# Patient Record
Sex: Female | Born: 2000 | Hispanic: Yes | Marital: Single | Smoking: Never smoker
Health system: Southern US, Community
[De-identification: ages and names within clinical notes are randomized; demographics above are authoritative.]

---
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
## 2020-01-02 ENCOUNTER — Ambulatory Visit: Payer: Self-pay | Admitting: Licensed Clinical Social Worker

## 2020-01-07 ENCOUNTER — Other Ambulatory Visit: Payer: Self-pay

## 2020-01-07 ENCOUNTER — Ambulatory Visit (INDEPENDENT_AMBULATORY_CARE_PROVIDER_SITE_OTHER): Payer: Medicaid Other | Admitting: Licensed Clinical Social Worker

## 2020-01-07 DIAGNOSIS — F4323 Adjustment disorder with mixed anxiety and depressed mood: Secondary | ICD-10-CM

## 2020-01-07 NOTE — BH Specialist Note (Signed)
Integrated Behavioral Health Follow Up Visit  MRN: [id number] Name: Patty Patterson  Number of Integrated Behavioral Health Clinician visits: 7 Session Start time: 2:47  Session End time: 3:20 Total time: 33  Type of Service: Integrated Behavioral Health- Individual/Family Interpretor:No. Interpretor Name and Language: n/a  SUBJECTIVE: Patty Patterson is a 19 y.o. female accompanied by Self Patient was referred by Dr. Wynetta Patterson for mood/anxiety concerns. Patient reports the following symptoms/concerns: Pt reports leaving to move into her college dorm tomorrow 01/08/20, and having mixed emotions. Pt reports looking forward to starting college, is worried about doing things by herself as well as leaving her mom alone. Duration of problem: weeks; Severity of problem: moderate  OBJECTIVE: Mood: Anxious and Euthymic and Affect: Appropriate and Tearful Risk of harm to self or others: No plan to harm self or others  LIFE CONTEXT: Family and Social: Lives w/ mom, family nearby, is worried about leaving mom School/Work: leaving for freshman year of college, will be living on campus at [employer] Self-Care: Pt likes to listen to music and watch movies Life Changes: Graduating high school, upcoming move to college  GOALS ADDRESSED: Patient will: 1.  Demonstrate ability to: implement coping strategies as approrpiate  INTERVENTIONS: Interventions utilized:  Supportive Counseling   Reflected and affirmed pt's mixed emotions Standardized Assessments completed: Not Needed  ASSESSMENT: Patient currently experiencing anxiety in anticipation of upcoming move to college.   Patient may benefit from implementing coping strategies as appropriate.  PLAN: 1. Follow up with behavioral health clinician on : None scheduled 2. Behavioral recommendations: Pt will keep familiar routine, will reach out to college mental health services as needed 3. Referral(s): [facility]  (LME/Outside Clinic) 4. "From scale of 1-10, how likely are you to follow plan?": Pt voiced understanding and agreement  Patty Patterson, [facility]

## 2020-01-21 ENCOUNTER — Telehealth: Payer: Self-pay

## 2020-01-21 ENCOUNTER — Telehealth: Payer: Medicaid Other

## 2020-01-21 NOTE — Telephone Encounter (Signed)
Please call pt back to reschedule appt.

## 2020-01-21 NOTE — Telephone Encounter (Signed)
Scheduled

## 2020-01-28 ENCOUNTER — Ambulatory Visit: Payer: Medicaid Other | Admitting: Pediatrics

## 2020-01-28 ENCOUNTER — Telehealth (INDEPENDENT_AMBULATORY_CARE_PROVIDER_SITE_OTHER): Payer: Medicaid Other | Admitting: Pediatrics

## 2020-01-28 DIAGNOSIS — N946 Dysmenorrhea, unspecified: Secondary | ICD-10-CM | POA: Diagnosis not present

## 2020-01-28 DIAGNOSIS — Z975 Presence of (intrauterine) contraceptive device: Secondary | ICD-10-CM | POA: Diagnosis not present

## 2020-01-28 DIAGNOSIS — K5900 Constipation, unspecified: Secondary | ICD-10-CM

## 2020-01-28 MED ORDER — POLYETHYLENE GLYCOL 3350 17 GM/SCOOP PO POWD: 17.0000 g | Freq: Every day | ORAL | 0 refills | Status: DC

## 2020-01-28 NOTE — Progress Notes (Addendum)
This note is not being shared with the patient for the following reason: To prevent harm (release of this note would result in harm to the life or physical safety of the patient or another).  THIS RECORD MAY CONTAIN CONFIDENTIAL INFORMATION THAT SHOULD NOT BE RELEASED WITHOUT REVIEW OF THE SERVICE PROVIDER.  Virtual Follow-Up Visit via Video Note  I connected with Patty Patterson on 01/28/20 at 10:00 AM EDT by a video enabled telemedicine application and verified that I am speaking with the correct person using two identifiers.   Patient/parent location: [place], [place]   I discussed the limitations of evaluation and management by telemedicine and the availability of in person appointments.  I discussed that the purpose of this telehealth visit is to provide medical care while limiting exposure to the novel coronavirus.  The patient expressed understanding and agreed to proceed.   Patty Patterson is a 19 y.o. female referred by Marijo File, MD here today for follow-up of Nexplanon placement..  Previsit planning completed:  yes   History was provided by the patient.  Plan from Last Visit:   Nexplanon placement   Chief Complaint: Menstrual cramps and constipation  History of Present Illness:  Patty Patterson is an 19 y/o F presenting today for follow-up of Nexplanon placement on 12/19/2019. She states that she started her period a little over 1 week ago and is still currently on her period. She has been able to wear smaller pads daily and is only exhibiting some spotting. This is much less than the flow she was experiencing previously. She can still actively palpate the Nexplanon and there is no pain, swelling, or erythema at the insertion site.   She states that in the past she has had some lower abdominal pain, primarily with bowel movements with some associated straining. She describes bowel movements as "solid" and typically has them 1-2 times per day. Denies hematochezia or emesis.  Every once in a while she has some diarrhea, but she has not had any episodes for 2-3 weeks. The diarrhea occurs when she tries to "hold in" her bowel movements. For the last two days, she has had abdominal pain upon waking, described as "cramping." She has had menstrual cramps before, but these seem slightly worse. She has not tried anything to make this pain better, though leaning forward helps slightly. Once she has a bowel movement, the pain resolves. Denies family history of chronic abdominal pain or inflammatory bowel disease. Prior to having the Nexplanon in, she would use tampons and this would precipitate the abdominal pain/cramping. No specific correlation with foods.   ROS:  Review of Systems  All other systems reviewed and are negative.  No Known Allergies Outpatient Medications Prior to Visit  Medication Sig Dispense Refill  . cetirizine (ZYRTEC) 10 MG tablet Take 1 tablet (10 mg total) by mouth daily. (Patient not taking: Reported on 12/05/2019) 30 tablet 3  . Norethindrone Acetate-Ethinyl Estradiol (JUNEL 1.5/30) 1.5-30 MG-MCG tablet Take 1 tablet by mouth daily. 28 tablet 11   No facility-administered medications prior to visit.     Patient Active Problem List   Diagnosis Date Noted  . Adjustment disorder 07/25/2019  . Keratosis pilaris 01/11/2016   Visual Observations/Objective:  Limited in the setting of telemedicine visit.  General Appearance: Well nourished well developed, in no apparent distress.  Eyes: conjunctiva no swelling or erythema ENT/Mouth: No hoarseness, No cough for duration of visit.  Neck: Supple  Respiratory: Respiratory effort normal, normal rate, no retractions or distress.  Cardio: Appears well-perfused, noncyanotic Musculoskeletal: no obvious deformity Skin: visible skin without rashes, ecchymosis, erythema. Site of Nexplanon insertion clean and without surrounding erythema or drainage. Nexplanon is palpable per patient.  Neuro: Awake and oriented X  3,  Psych:  normal affect, Insight and Judgment appropriate.   Assessment/Plan:  Patty Patterson is an 18y/o F with a PMH of adjustment disorder presents today via telemedicine for follow-up of Nexplanon placement. The site of insertion is clean, without signs of surrounding erythema or drainage, and the Nexplanon is palpable without pain. She admits to some spotting beginning about 1 week ago with the start of her period, but is overall pleased that the amount of bleeding during menstruation has significantly decreased. Patty Patterson admits to some lower abdominal pain that seems like a mixed picture of menstrual cramps and constipation. She is having worsening cramping in the morning now that she is on her period, when previously she would only experience lower abdominal cramping prior to a bowel movement. She describes her bowel movements as "solid" with some associated straining, consistent with constipation. I recommended trialing ibuprofen for menstrual cramps and beginning Miralax daily for constipation, with follow-up if the abdominal pain does not improve. Patty Patterson expressed understanding of and agreement with the plan.   1. Constipation, unspecified constipation type - Miralax one capful daily  2. Menstrual cramps - Ibuprofen PRN   BH screenings:  PHQ-SADS Last 3 Score only 10/01/2019 08/02/2019  PHQ-15 Score 3 10  Total GAD-7 Score 11 9  PHQ-9 Total Score 8 10   Screens discussed with patient and parent and adjustments to plan made accordingly.   I discussed the assessment and treatment plan with the patient and/or parent/guardian.  They were provided an opportunity to ask questions and all were answered.  They agreed with the plan and demonstrated an understanding of the instructions. They were advised to call back or seek an in-person evaluation in the emergency room if the symptoms worsen or if the condition fails to improve as anticipated.   Follow-up: If symptoms worsen or fail to  improve  Medical decision-making:   I spent 20 minutes on this telehealth visit inclusive of face-to-face video and care coordination time I was located at [facility] during this encounter.   Christophe Louis, DO [facility], PGY-2    CC: Marijo File, MD, Marijo File, MD

## 2020-02-19 NOTE — Progress Notes (Signed)
I have reviewed the resident's note and plan of care and helped develop the plan as necessary.  Ceasar Decandia, FNP   

## 2020-03-11 ENCOUNTER — Encounter: Payer: Self-pay | Admitting: Pediatrics

## 2020-03-11 ENCOUNTER — Other Ambulatory Visit: Payer: Self-pay

## 2020-03-11 ENCOUNTER — Other Ambulatory Visit: Payer: Self-pay | Admitting: Pediatrics

## 2020-03-11 ENCOUNTER — Ambulatory Visit (INDEPENDENT_AMBULATORY_CARE_PROVIDER_SITE_OTHER): Payer: Medicaid Other | Admitting: Pediatrics

## 2020-03-11 VITALS — Temp 98.2°F | Wt 127.0 lb

## 2020-03-11 DIAGNOSIS — H6122 Impacted cerumen, left ear: Secondary | ICD-10-CM | POA: Diagnosis not present

## 2020-03-11 MED ORDER — DEBROX 6.5 % OT SOLN: 5.0000 [drp] | Freq: Two times a day (BID) | OTIC | 0 refills | Status: DC

## 2020-03-11 MED ORDER — IBUPROFEN 200 MG PO TABS: 200.0000 mg | ORAL_TABLET | Freq: Four times a day (QID) | ORAL | 0 refills | Status: DC | PRN

## 2020-03-11 NOTE — Progress Notes (Signed)
History was provided by the patient.  Patty Patterson is a 19 y.o. female who is here for left ear pain.     HPI:   Endorses onset of left ear pain mid-day yesterday, got worse last night, resulted in difficulty sleeping. Feels like pain is on the inside. Pain was still somewhat bad this morning but is a little better now. Has not tried any medicines to help. Denies recent fevers, chills, sore throat, cough, congestion, ear swelling, or drainage. Sounds are a little deeper on the left compared to the right, otherwise no hearing difficulties. Cleans ears with Q-tips daily after the shower. History of swimmer's ear in the past, can't remember which ear. No recent swimming.   The following portions of the patient's history were reviewed and updated as appropriate: allergies, current medications, past family history, past medical history, past social history, past surgical history and problem list.  Physical Exam:  Temp 98.2 F (36.8 C) (Temporal)   Wt 127 lb (57.6 kg)   Breastfeeding No   BMI 25.26 kg/m   Blood pressure percentiles are not available for patients who are 18 years or older.  No LMP recorded. Patient has had an implant.    General:   alert, cooperative and no distress     Skin:   normal  Oral cavity:   lips, mucosa, and tongue normal; teeth and gums normal  Eyes:   sclerae white, pupils equal and reactive  Ears:   some cerumen present on the R but partially visualized TM normal, small amount of cerumen present; cerumen impaction present on left, following lavage whitish material still present on TM (unsure if dead skin vs residual cerumen)  Nose: clear, no discharge  Neck:  Neck appearance: Normal; no LAD  Lungs:  comfortable WOB  Heart:   cap refill <2 seconds  Abdomen:  soft, non-distended  GU:  not examined  Extremities:   extremities normal, atraumatic, no cyanosis or edema  Neuro:  normal without focal findings, mental status, speech normal, alert and oriented  x3 and PERLA    Assessment/Plan: 1. Impacted cerumen of left ear 19 year old female presenting with two days of left ear otalgia, with no associated systemic or upper respiratory symptoms, and no ear drainage. Physical exam notable for cerumen impaction of the left ear. Irrigation/lavage performed with exam afterward notable for persistent whitish material still present on TM (unsure if dead skin vs residual cerumen vs cholesteatoma). Recommend use of debrox drops in bilateral ears for 5 days with repeat physical exam afterward. - carbamide peroxide (DEBROX) 6.5 % OTIC solution; Place 5 drops into both ears 2 (two) times daily for 5 days.  Dispense: 2.5 mL; Refill: 0 - Advised patient to follow up for repeat exam in ~5 days (either with our clinic or with clinic closer to her college, [employer]). If repeat exam abnormal will obtain hearing screen and refer to ENT - Recommended avoiding use of Q-tips to clean ears - Can use tylenol or ibuprofen as needed for residual pain - Return precautions provided  - Immunizations today: none  - Follow-up visit in ~5 days  Phillips Odor, MD  03/11/20

## 2020-03-11 NOTE — Patient Instructions (Addendum)
It was a pleasure seeing you today! You were evaluated for left ear pain, which was likely due to a cerumen impaction (or obstruction of ear wax). Your ear was washed out with water. Please do not continue to clean your ears with Q-tips. Apply the debrox drops to both ears for five days, after which it is recommended you either follow up with our clinic or with another doctor to have your ears re-examined. You can take tylenol or ibuprofen as needed for continued pain after today, but please give [place] a call back if you develop fever, ear drainage, ear swelling, or worsening of pain.

## 2020-03-16 ENCOUNTER — Ambulatory Visit: Payer: Medicaid Other | Admitting: Pediatrics

## 2020-11-19 ENCOUNTER — Other Ambulatory Visit (HOSPITAL_COMMUNITY)
Admission: RE | Admit: 2020-11-19 | Discharge: 2020-11-19 | Disposition: A | Discharge status: Home / Self Care | Payer: Medicaid Other | Source: Ambulatory Visit | Attending: Pediatrics | Admitting: Pediatrics

## 2020-11-19 ENCOUNTER — Encounter: Payer: Self-pay | Admitting: Pediatrics

## 2020-11-19 ENCOUNTER — Other Ambulatory Visit: Payer: Self-pay

## 2020-11-19 ENCOUNTER — Ambulatory Visit (INDEPENDENT_AMBULATORY_CARE_PROVIDER_SITE_OTHER): Payer: Medicaid Other | Admitting: Pediatrics

## 2020-11-19 VITALS — BP 120/71 | HR 93 | Ht 59.45 in | Wt 132.4 lb

## 2020-11-19 DIAGNOSIS — Z0000 Encounter for general adult medical examination without abnormal findings: Secondary | ICD-10-CM

## 2020-11-19 DIAGNOSIS — Z114 Encounter for screening for human immunodeficiency virus [HIV]: Secondary | ICD-10-CM | POA: Diagnosis not present

## 2020-11-19 DIAGNOSIS — N926 Irregular menstruation, unspecified: Secondary | ICD-10-CM | POA: Diagnosis not present

## 2020-11-19 DIAGNOSIS — Z113 Encounter for screening for infections with a predominantly sexual mode of transmission: Secondary | ICD-10-CM

## 2020-11-19 DIAGNOSIS — Z68.41 Body mass index (BMI) pediatric, 85th percentile to less than 95th percentile for age: Secondary | ICD-10-CM | POA: Diagnosis not present

## 2020-11-19 LAB — URINE CYTOLOGY ANCILLARY ONLY
Chlamydia: NEGATIVE
Comment: NEGATIVE
Comment: NORMAL
Neisseria Gonorrhea: NEGATIVE

## 2020-11-19 LAB — POCT RAPID HIV: Rapid HIV, POC: NEGATIVE

## 2020-11-19 MED ORDER — IBUPROFEN 600 MG PO TABS: 600.0000 mg | ORAL_TABLET | Freq: Three times a day (TID) | ORAL | 0 refills | Status: AC

## 2020-11-19 NOTE — Progress Notes (Signed)
Adolescent Well Care Visit Patty Patterson is a 20 y.o. female who is here for well care.    PCP:  Marijo File, MD   History was provided by the patient.  Current Issues: Current concerns include: No concerns today. Overall doing well. H/o 2 UTIs over the past yr- seen at student health at [employer].   Nutrition: Nutrition/Eating Behaviors: eats a variety of foods, attempting to eat better  Adequate calcium in diet?: yes Supplements/ Vitamins: no  Exercise/ Media: Play any Sports?/ Exercise: walks & also goes to the gym daily  Sleep:  Sleep: No issues  Social Screening: Lives with:  dorm at [employer]. Now home for summer. Parental relations:  good Activities, Work, and Regulatory affairs officer?: works at [employer] rom Concerns regarding behavior with peers?  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 Patient has had an implant. Menstrual History: irregular bleeding off & on. Had bleeding for a month recently. No STIs.   Confidential Social History: Tobacco?  no Secondhand smoke exposure?  no Drugs/ETOH?  no  Sexually Active?  yes   Pregnancy Prevention: Nexplanon  Safe at home, in school & in relationships?  Yes Safe to self?  Yes   Screenings: Patient has a dental home: yes  The patient completed the Rapid Assessment for Adolescent Preventive Services screening questionnaire and the following topics were identified as risk factors and discussed: healthy eating, exercise, marijuana use, drug use, and mental health issues  PHQ-9 completed and results indicated- negative  Physical Exam:  Vitals:   11/19/20 1105  BP: 120/71  Pulse: 93  SpO2: 99%  Weight: 132 lb 6 oz (60 kg)  Height: 4' 11.45" (1.51 m)   BP 120/71   Pulse 93   Ht 4' 11.45" (1.51 m)   Wt 132 lb 6 oz (60 kg)   SpO2 99%   BMI 26.33 kg/m  Body mass index: body mass index is 26.33 kg/m. Blood  pressure percentiles are not available for patients who are 18 years or older.  Hearing Screening  Method: Audiometry   500Hz  1000Hz  2000Hz  4000Hz   Right ear 20 20 20 20   Left ear 20 20 20 20    Vision Screening   Right eye Left eye Both eyes  Without correction 20/20 20/20 20/20   With correction       General Appearance:   alert, oriented, no acute distress  HENT: Normocephalic, no obvious abnormality, conjunctiva clear  Mouth:   Normal appearing teeth, no obvious discoloration, dental caries, or dental caps  Neck:   Supple; thyroid: no enlargement, symmetric, no tenderness/mass/nodules  Chest normal  Lungs:   Clear to auscultation bilaterally, normal work of breathing  Heart:   Regular rate and rhythm, S1 and S2 normal, no murmurs;   Abdomen:   Soft, non-tender, no mass, or organomegaly  GU normal female external genitalia, pelvic not performed  Musculoskeletal:   Tone and strength strong and symmetrical, all extremities               Lymphatic:   No cervical adenopathy  Skin/Hair/Nails:   Skin warm, dry and intact, no rashes, no bruises or petechiae  Neurologic:   Strength, gait, and coordination normal and age-appropriate     Assessment and Plan:   20 yr old well adolescent Has Nexplanon with breakthrough bleeding Will send script for ibuprofen to be taken tid for 5 days  Discussed use of condoms.  BMI is appropriate for age  Hearing screening result:normal Vision screening result: normal  Counseling provided for all of the vaccine components  Orders Placed This Encounter  Procedures   POCT Rapid HIV     Return in 1 year (on 11/19/2021).Marijo File, MD

## 2020-11-19 NOTE — Patient Instructions (Signed)
Goals: Choose more whole grains, lean protein, low-fat dairy, and fruits/non-starchy vegetables. Aim for 60 min of moderate physical activity daily. Limit sugar-sweetened beverages and concentrated sweets. Limit screen time to less than 2 hours daily.  53210 5 servings of fruits/vegetables a day 3 meals a day, no meal skipping 2 hours of screen time or less 1 hour of vigorous physical activity Almost no sugar-sweetened beverages or foods    

## 2020-12-29 ENCOUNTER — Encounter: Payer: Self-pay | Admitting: Pediatrics

## 2023-01-11 DIAGNOSIS — H5213 Myopia, bilateral: Secondary | ICD-10-CM | POA: Diagnosis not present

## 2023-04-07 DIAGNOSIS — H5213 Myopia, bilateral: Secondary | ICD-10-CM | POA: Diagnosis not present
